# Patient Record
Sex: Male | Born: 1975 | Race: Black or African American | Hispanic: No | Marital: Single | Smoking: Never smoker
Health system: Southern US, Community
[De-identification: ages and names within clinical notes are randomized; demographics above are authoritative.]

---

## 2019-12-08 ENCOUNTER — Emergency Department (HOSPITAL_COMMUNITY)

## 2019-12-08 ENCOUNTER — Encounter (HOSPITAL_COMMUNITY): Payer: Self-pay | Admitting: Emergency Medicine

## 2019-12-08 ENCOUNTER — Emergency Department (HOSPITAL_COMMUNITY)
Admission: EM | Admit: 2019-12-08 | Discharge: 2019-12-08 | Attending: Emergency Medicine | Admitting: Emergency Medicine

## 2019-12-08 ENCOUNTER — Other Ambulatory Visit: Payer: Self-pay

## 2019-12-08 DIAGNOSIS — Y92148 Other place in prison as the place of occurrence of the external cause: Secondary | ICD-10-CM | POA: Diagnosis not present

## 2019-12-08 DIAGNOSIS — S0501XA Injury of conjunctiva and corneal abrasion without foreign body, right eye, initial encounter: Secondary | ICD-10-CM | POA: Insufficient documentation

## 2019-12-08 DIAGNOSIS — H209 Unspecified iridocyclitis: Secondary | ICD-10-CM | POA: Diagnosis not present

## 2019-12-08 DIAGNOSIS — S0591XA Unspecified injury of right eye and orbit, initial encounter: Secondary | ICD-10-CM | POA: Diagnosis present

## 2019-12-08 DIAGNOSIS — W500XXA Accidental hit or strike by another person, initial encounter: Secondary | ICD-10-CM | POA: Diagnosis not present

## 2019-12-08 DIAGNOSIS — Y9389 Activity, other specified: Secondary | ICD-10-CM | POA: Insufficient documentation

## 2019-12-08 DIAGNOSIS — Y999 Unspecified external cause status: Secondary | ICD-10-CM | POA: Insufficient documentation

## 2019-12-08 MED ORDER — TETRACAINE HCL 0.5 % OP SOLN
2.0000 [drp] | Freq: Once | OPHTHALMIC | Status: AC
  Administered 2019-12-08: 2 [drp] via OPHTHALMIC
  Filled 2019-12-08: qty 4

## 2019-12-08 MED ORDER — OXYCODONE-ACETAMINOPHEN 5-325 MG PO TABS
1.0000 | ORAL_TABLET | Freq: Once | ORAL | Status: AC
  Administered 2019-12-08: 1 via ORAL
  Filled 2019-12-08: qty 1

## 2019-12-08 MED ORDER — ERYTHROMYCIN 5 MG/GM OP OINT
TOPICAL_OINTMENT | Freq: Four times a day (QID) | OPHTHALMIC | Status: DC
  Administered 2019-12-08: 1 via OPHTHALMIC
  Filled 2019-12-08: qty 3.5

## 2019-12-08 MED ORDER — OFLOXACIN 0.3 % OP SOLN
1.0000 [drp] | Freq: Four times a day (QID) | OPHTHALMIC | Status: DC
  Filled 2019-12-08: qty 5

## 2019-12-08 MED ORDER — FLUORESCEIN SODIUM 1 MG OP STRP
1.0000 | ORAL_STRIP | Freq: Once | OPHTHALMIC | Status: AC
  Administered 2019-12-08: 1 via OPHTHALMIC
  Filled 2019-12-08: qty 1
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
## 2019-12-08 NOTE — Discharge Instructions (Addendum)
Apply a small piece of the ointment to your right eye every 4 hrs and apply 1 drop of the ofloxacin to the right eye 4 times a day.  As discussed, I have spoken with the ophthalmology provider here and you will need to see ophthalmology this week for recheck,  Preferably in 2 to 3 days.

## 2019-12-08 NOTE — ED Triage Notes (Signed)
Seen at [facility] 3/26  Corrections was told to take to specialist Per pt he has seen no specialist  Continues to get worse   Now pain is unbearable and continues to see  that there is something in his eye

## 2021-02-23 IMAGING — CT CT ORBITS W/O CM
3 of 4 series · 14 of 47 positions shown, 17 images · non-contrast
Comparison: None.

CLINICAL DATA: Initial evaluation for acute headache. Recent right
eye trauma.

EXAM:
CT ORBITS WITHOUT CONTRAST
TECHNIQUE: Multidetector CT images were obtained using the standard protocol
without intravenous contrast.

[Series 2: orbits soft · axial · 0.39mm/px · z∈[+1288,+1388]mm · 10 of 60 slices shown, 13 images]
[im 5/60  brain]
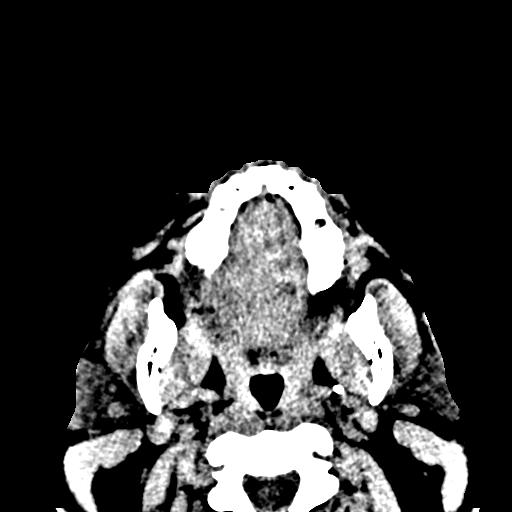
[im 5/60  bone]
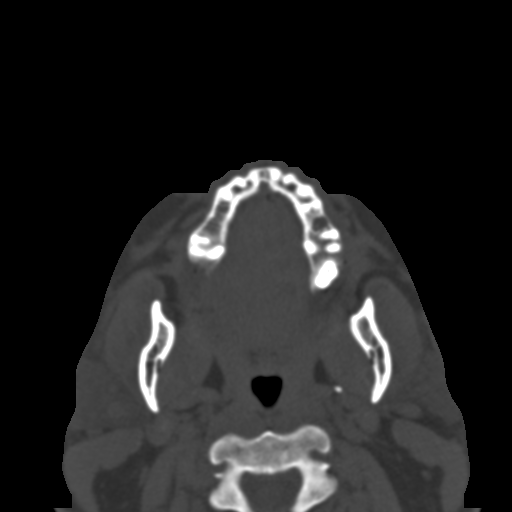
[im 11/60  bone]
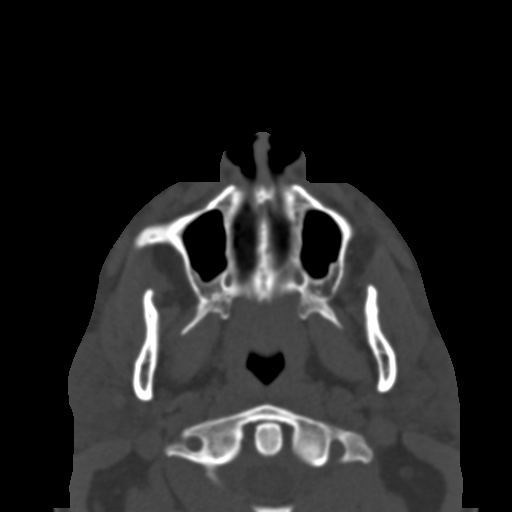
[im 17/60  bone]
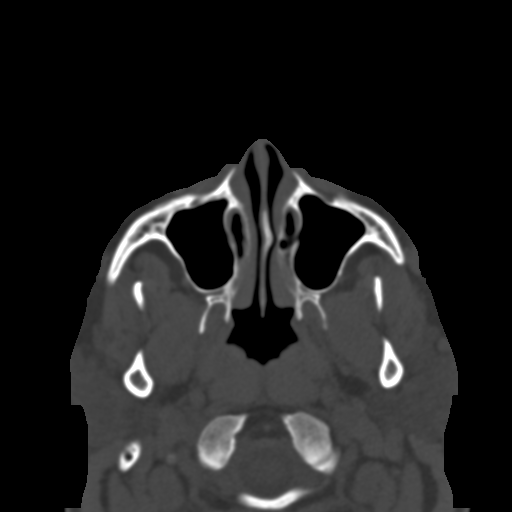
[im 21/60  bone]
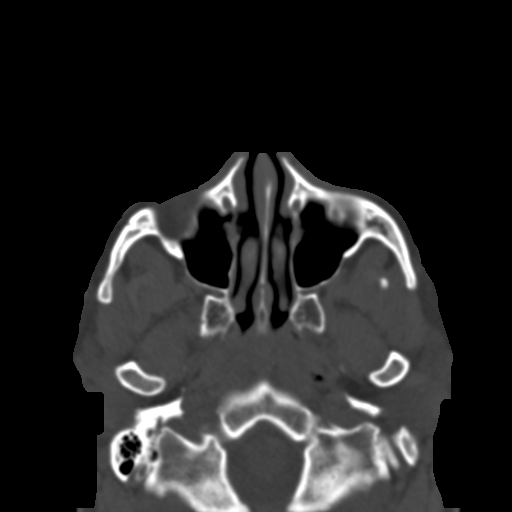
[im 27/60  brain]
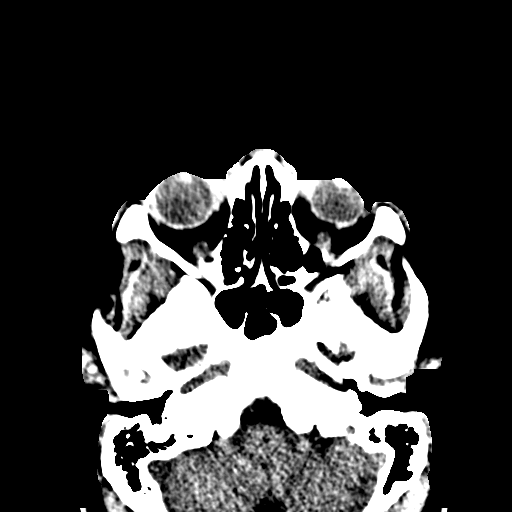
[im 27/60  bone]
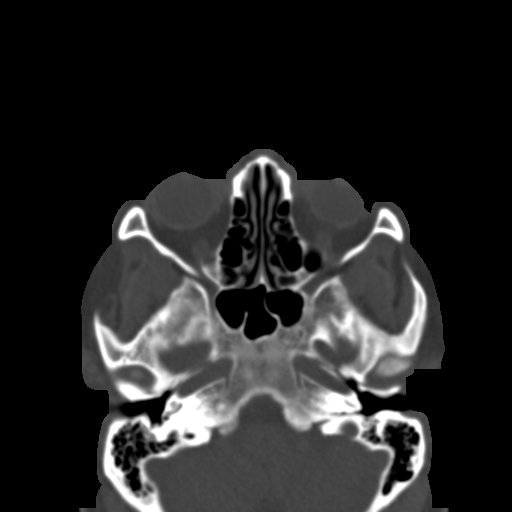
[im 33/60  bone]
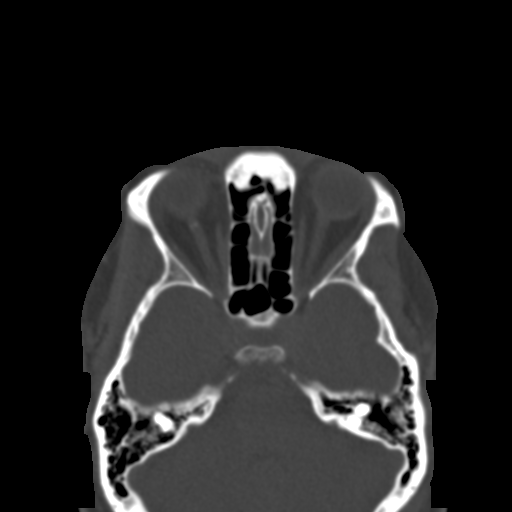
[im 39/60  bone]
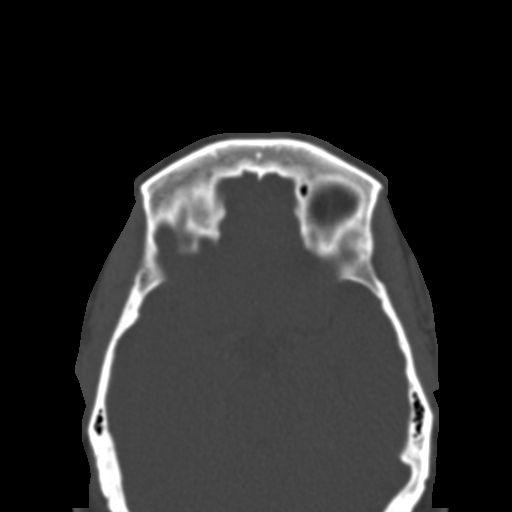
[im 45/60  bone]
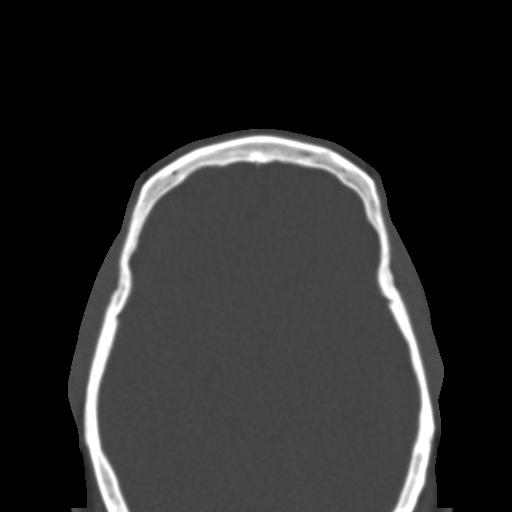
[im 49/60  brain]
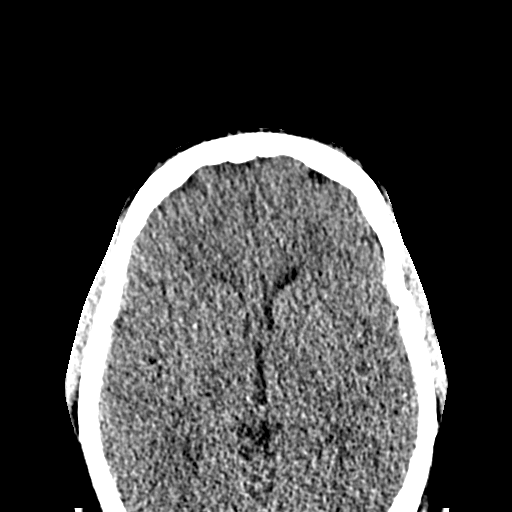
[im 49/60  bone]
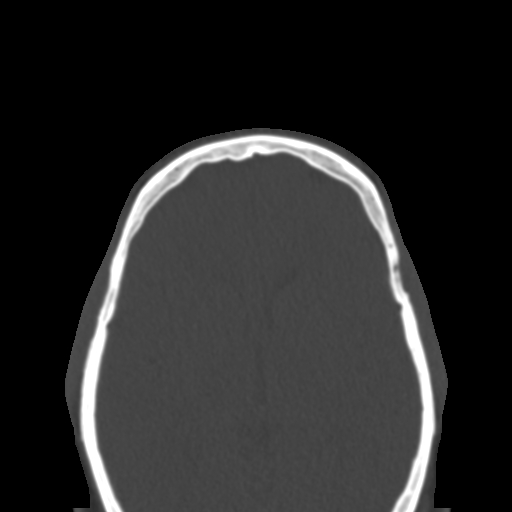
[im 55/60  bone]
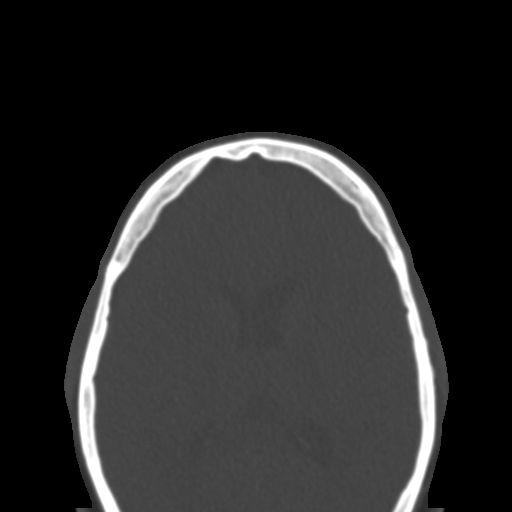

[Series 5: sagittal bone · sagittal · 0.25mm/px · 1 of 92 slices shown]
[im 46/92  bone]
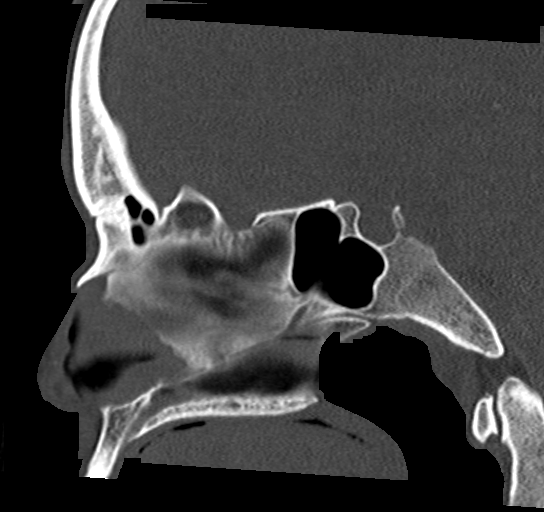

[Series 6: coronal soft · coronal · 0.24mm/px · 3 of 59 slices shown]
[im 20/59  bone]
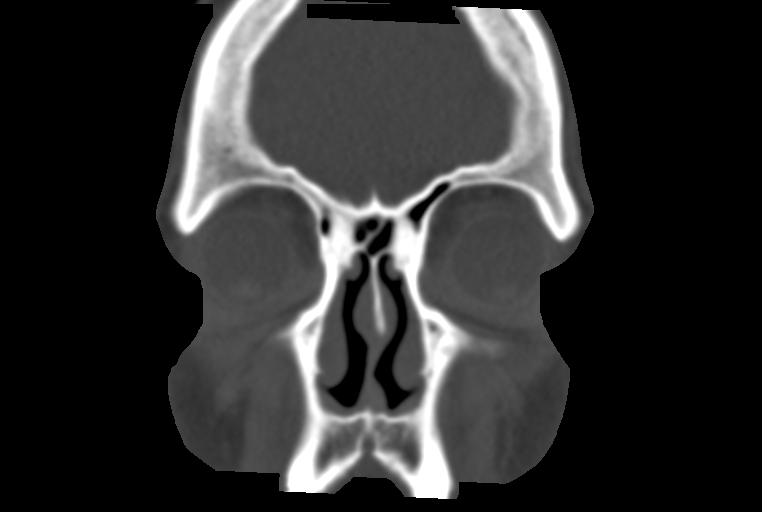
[im 26/59  bone]
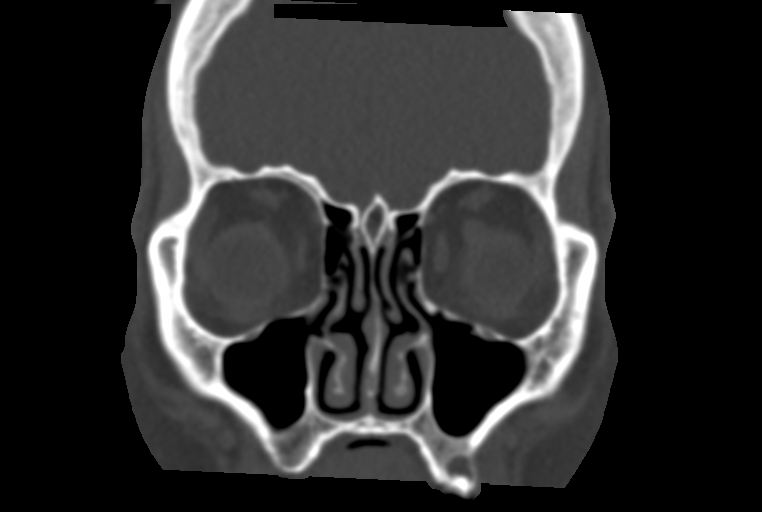
[im 33/59  bone]
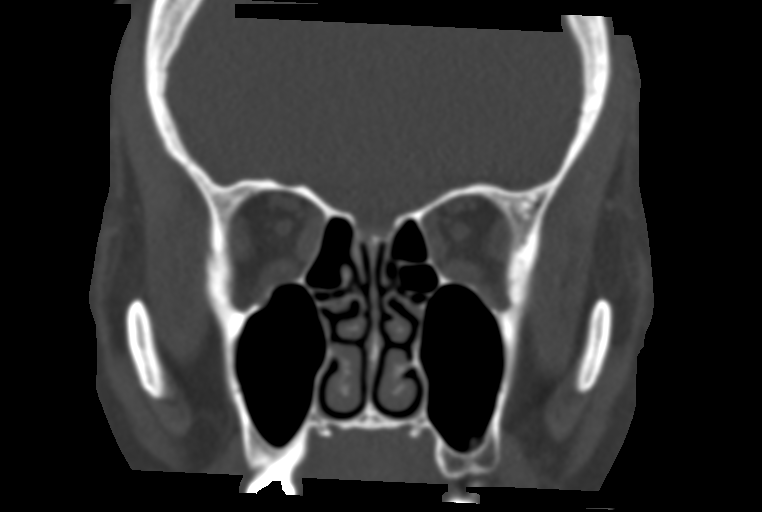

[14 of 47 positions shown; findings below may reference images not displayed]

FINDINGS: Orbits: Globes are symmetric in size with normal appearance and
morphology bilaterally. Lenses normally located. No evidence for
acute orbital trauma. Lacrimal glands within normal limits.
Intraconal and extraconal fat well-maintained. Extra-ocular muscles
symmetric and normal. Superior orbital veins within normal limits.
No abnormality about the orbital apices or cavernous sinus. Small
focal calcification seen along the superior aspect of the right
optic nerve sheath (series 6, image 28), of uncertain etiology or
significance. No visible underlying mass or mass effect. Bony orbits
intact without fracture.

Visualized sinuses: Visualized paranasal sinuses are clear. Mastoid
air cells and middle ear cavities are well pneumatized and free of
fluid.

Soft tissues: No acute periorbital soft tissue swelling. Remainder
the visualized soft tissues of the face demonstrate no acute
finding. Dental Yapara noted at the left maxillary second bicuspid
without associated inflammation.

Limited intracranial: Unremarkable.
IMPRESSION: 1. No acute abnormality identified about the orbits.
2. Focal calcification involving the right optic nerve as above.
Finding is of uncertain etiology and significance, and may be
idiopathic in nature. Calcifications can also be seen in relation to
underlying optic nerve sheath meningiomas, although no visible mass
is seen on this noncontrast examination.
3. Dental Yapara at the left maxillary second bicuspid without
associated inflammation.
